# Patient Record
Sex: Female | Born: 1965 | Hispanic: Yes | Marital: Single | Smoking: Never smoker
Health system: Southern US, Community
[De-identification: ages and names within clinical notes are randomized; demographics above are authoritative.]

## PROBLEM LIST (undated history)

## (undated) HISTORY — PX: PARTIAL HYSTERECTOMY: SHX80

---

## 2020-10-25 ENCOUNTER — Emergency Department (HOSPITAL_COMMUNITY): Payer: Self-pay

## 2020-10-25 ENCOUNTER — Other Ambulatory Visit: Payer: Self-pay

## 2020-10-25 ENCOUNTER — Emergency Department (HOSPITAL_COMMUNITY)
Admission: EM | Admit: 2020-10-25 | Discharge: 2020-10-25 | Disposition: A | Discharge status: Home / Self Care | Payer: Self-pay | Attending: Emergency Medicine | Admitting: Emergency Medicine

## 2020-10-25 DIAGNOSIS — D329 Benign neoplasm of meninges, unspecified: Secondary | ICD-10-CM | POA: Insufficient documentation

## 2020-10-25 DIAGNOSIS — M25531 Pain in right wrist: Secondary | ICD-10-CM | POA: Insufficient documentation

## 2020-10-25 DIAGNOSIS — R52 Pain, unspecified: Secondary | ICD-10-CM

## 2020-10-25 DIAGNOSIS — W19XXXA Unspecified fall, initial encounter: Secondary | ICD-10-CM

## 2020-10-25 DIAGNOSIS — M542 Cervicalgia: Secondary | ICD-10-CM | POA: Insufficient documentation

## 2020-10-25 DIAGNOSIS — R930 Abnormal findings on diagnostic imaging of skull and head, not elsewhere classified: Secondary | ICD-10-CM | POA: Insufficient documentation

## 2020-10-25 DIAGNOSIS — W06XXXA Fall from bed, initial encounter: Secondary | ICD-10-CM | POA: Insufficient documentation

## 2020-10-25 NOTE — ED Notes (Signed)
Pt transported to CT ?

## 2020-10-25 NOTE — ED Provider Notes (Signed)
[facility] EMERGENCY DEPARTMENT Provider Note   CSN: [id number] Arrival date & time: 10/25/20  1021     History Chief Complaint  Patient presents with   Fall    Madison Patterson is a 55 y.o. female.  Patient is a 55 yo female presenting for fall. Pt admits to falling out of bed early Wednesday morning, greater than 24 hours ago, while sleeping. Admits to head trauma-hit head on dresser, with residual midline neck pain. Patient states she also fell on her right side injuring her right wrist and right shin. States she has otherwise been feeling well. Denies repeat falls or syncope. Denies sensation or motor deficits.   The history is provided by the patient. No language interpreter was used.  Fall This is a new problem. The current episode started yesterday. The problem has been resolved. Pertinent negatives include no chest pain, no abdominal pain and no shortness of breath.      No past medical history on file.  There are no problems to display for this patient.    OB History   No obstetric history on file.     No family history on file.     Home Medications Prior to Admission medications   Not on File    Allergies    Patient has no allergy information on record.  Review of Systems   Review of Systems  Constitutional:  Negative for chills and fever.  HENT:  Negative for ear pain and sore throat.   Eyes:  Negative for pain and visual disturbance.  Respiratory:  Negative for cough and shortness of breath.   Cardiovascular:  Negative for chest pain and palpitations.  Gastrointestinal:  Negative for abdominal pain and vomiting.  Genitourinary:  Negative for dysuria and hematuria.  Musculoskeletal:  Positive for neck pain. Negative for arthralgias and back pain.       Right wrist pain   Skin:  Negative for color change and rash.  Neurological:  Negative for seizures and syncope.  All other systems reviewed and are negative.  Physical  Exam Updated Vital Signs BP 121/67   Pulse (!) 59   Temp 98.1 F (36.7 C)   Resp 16   SpO2 100%   Physical Exam Vitals and nursing note reviewed.  Constitutional:      General: She is not in acute distress.    Appearance: She is well-developed.  HENT:     Head: Normocephalic and atraumatic.  Eyes:     General: Lids are normal.     Conjunctiva/sclera: Conjunctivae normal.     Pupils: Pupils are equal, round, and reactive to light.  Cardiovascular:     Rate and Rhythm: Normal rate and regular rhythm.     Heart sounds: No murmur heard. Pulmonary:     Effort: Pulmonary effort is normal. No respiratory distress.     Breath sounds: Normal breath sounds.  Abdominal:     Palpations: Abdomen is soft.     Tenderness: There is no abdominal tenderness.  Musculoskeletal:     Right shoulder: No bony tenderness.     Left shoulder: No bony tenderness.     Right upper arm: No bony tenderness.     Left upper arm: No bony tenderness.     Right elbow: No tenderness.     Left elbow: No tenderness.     Right forearm: No bony tenderness.     Left forearm: No bony tenderness.     Right wrist: Bony tenderness present.  Normal pulse.     Left wrist: No bony tenderness. Normal pulse.     Right hand: No bony tenderness.     Left hand: No bony tenderness.     Cervical back: Neck supple. Bony tenderness present.     Thoracic back: No bony tenderness.     Lumbar back: No bony tenderness.  Skin:    General: Skin is warm and dry.       Neurological:     Mental Status: She is alert.     GCS: GCS eye subscore is 4. GCS verbal subscore is 5. GCS motor subscore is 6.     Sensory: Sensation is intact.     Motor: Motor function is intact.     Coordination: Coordination is intact.     Gait: Gait is intact.    ED Results / Procedures / Treatments   Labs (all labs ordered are listed, but only abnormal results are displayed) Labs Reviewed - No data to display  EKG None  Radiology DG Thoracic  Spine 2 View  Result Date: 10/25/2020 CLINICAL DATA:  Back pain after fall from bed yesterday EXAM: THORACIC SPINE 2 VIEWS COMPARISON:  None. FINDINGS: Mild midthoracic disc space narrowing and spondylitic spurring which is fairly bulky for age. No evidence of superimposed fracture. No subluxation. No appreciable bone lesion. Maintained posterior mediastinal fat planes. IMPRESSION: 1. No acute or focal finding. 2. Midthoracic spondylosis. Electronically Signed   By: Monte Fantasia M.D.   On: 10/25/2020 11:33   CT Head Wo Contrast  Result Date: 10/25/2020 CLINICAL DATA:  Minor head injury. EXAM: CT HEAD WITHOUT CONTRAST CT CERVICAL SPINE WITHOUT CONTRAST TECHNIQUE: Multidetector CT imaging of the head and cervical spine was performed following the standard protocol without intravenous contrast. Multiplanar CT image reconstructions of the cervical spine were also generated. COMPARISON:  None. FINDINGS: CT HEAD FINDINGS Brain: 2.3 x 1.9 cm rounded hyperdense abnormality is noted in the medial portion of the right parietal cortex adjacent to the interhemispheric fissure which crosses slightly to the left side. This most likely represents neoplasm such as meningioma. No definite hemorrhage is noted. No ventricular dilatation is noted. No midline shift is noted. No acute infarction is noted. Vascular: No hyperdense vessel or unexpected calcification. Skull: Normal. Negative for fracture or focal lesion. Sinuses/Orbits: No acute finding. Other: None. CT CERVICAL SPINE FINDINGS Alignment: Normal. Skull base and vertebrae: No acute fracture. No primary bone lesion or focal pathologic process. Soft tissues and spinal canal: No prevertebral fluid or swelling. No visible canal hematoma. Disc levels: Mild degenerative disc disease is noted at C4-5 and C5-6 with anterior osteophyte formation. Upper chest: Negative. Other: None. IMPRESSION: 2.3 cm rounded hyperdense abnormality is noted in medial portion of right parietal  cortex which crosses the midline slightly, most consistent with neoplasm such as meningioma. Further evaluation with MRI with and without gadolinium is recommended for further evaluation. Mild multilevel degenerative disc disease is noted in the cervical spine. No acute abnormality is noted. Electronically Signed   By: Marijo Conception M.D.   On: 10/25/2020 12:43   CT Cervical Spine Wo Contrast  Result Date: 10/25/2020 CLINICAL DATA:  Minor head injury. EXAM: CT HEAD WITHOUT CONTRAST CT CERVICAL SPINE WITHOUT CONTRAST TECHNIQUE: Multidetector CT imaging of the head and cervical spine was performed following the standard protocol without intravenous contrast. Multiplanar CT image reconstructions of the cervical spine were also generated. COMPARISON:  None. FINDINGS: CT HEAD FINDINGS Brain: 2.3 x 1.9 cm rounded hyperdense  abnormality is noted in the medial portion of the right parietal cortex adjacent to the interhemispheric fissure which crosses slightly to the left side. This most likely represents neoplasm such as meningioma. No definite hemorrhage is noted. No ventricular dilatation is noted. No midline shift is noted. No acute infarction is noted. Vascular: No hyperdense vessel or unexpected calcification. Skull: Normal. Negative for fracture or focal lesion. Sinuses/Orbits: No acute finding. Other: None. CT CERVICAL SPINE FINDINGS Alignment: Normal. Skull base and vertebrae: No acute fracture. No primary bone lesion or focal pathologic process. Soft tissues and spinal canal: No prevertebral fluid or swelling. No visible canal hematoma. Disc levels: Mild degenerative disc disease is noted at C4-5 and C5-6 with anterior osteophyte formation. Upper chest: Negative. Other: None. IMPRESSION: 2.3 cm rounded hyperdense abnormality is noted in medial portion of right parietal cortex which crosses the midline slightly, most consistent with neoplasm such as meningioma. Further evaluation with MRI with and without  gadolinium is recommended for further evaluation. Mild multilevel degenerative disc disease is noted in the cervical spine. No acute abnormality is noted. Electronically Signed   By: Marijo Conception M.D.   On: 10/25/2020 12:43    Procedures Procedures   Medications Ordered in ED Medications - No data to display  ED Course  I have reviewed the triage vital signs and the nursing notes.  Pertinent labs & imaging results that were available during my care of the patient were reviewed by me and considered in my medical decision making (see chart for details).    MDM Rules/Calculators/A&P                          6:28 PM  55 yo female presenting for fall out of bed while sleeping over 24 hours ago. No hx of blood thinner use. Patient is Aox3, no acute distress, afebrile, with stable vitals. Physical exam demonstrates no neurovascular deficits. CT head and cervical spine ordered by triage team. No new traumatic process, however, positive for brain meningioma. Discussion held with patient and daughter at bedside with recommendations for close follow up with neurology. Patient recommended to call tomorrow to establish appointment and to return to ED for any development of neurological dysfunction.  Physical exam also pertinent for right wrist pain. Xray demonstrates no fractures.  Patient in no distress and overall condition improved here in the ED. Detailed discussions were had with the patient regarding current findings, and need for close f/u with PCP or on call doctor. The patient has been instructed to return immediately if the symptoms worsen in any way for re-evaluation. Patient verbalized understanding and is in agreement with current care plan. All questions answered prior to discharge.      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

## 2020-10-25 NOTE — ED Provider Notes (Signed)
Emergency Medicine Provider Triage Evaluation Note  Madison Patterson , a 55 y.o. female  was evaluated in triage.  Pt complains of fall.  She fell getting out of bed and landed onto furniture on the other side of her bed.  She fell on the right side of her body and hit the back of her head.  She is not on any blood thinners, the pain is primarily to her thoracic back and also to her right hand and her feet bilaterally.  She has small abrasions on the knees bilaterally..  Review of Systems  Positive: Head pain, neck pain, back pain, hand pain, feet pain Negative: Vision changes, nausea, vomiting  Physical Exam  BP (!) 123/111 (BP Location: Left Arm)   Pulse 69   Temp 98.3 F (36.8 C)   Resp 18   SpO2 98%  Gen:   Awake, no distress   Resp:  Normal effort  MSK:   Moves extremities without difficulty.  Other:  Cranial nerves III through XII are grossly intact.  Medical Decision Making  Medically screening exam initiated at 10:33 AM.  Appropriate orders placed.  Sisilia Renovato was informed that the remainder of the evaluation will be completed by another provider, this initial triage assessment does not replace that evaluation, and the importance of remaining in the ED until their evaluation is complete.     Sherrill Raring, PA-C 10/25/20 1034    Valarie Merino, MD 10/27/20 [phone]

## 2020-10-25 NOTE — ED Triage Notes (Signed)
Pt from home for fall yesterday at 0600 where she fell and hit a piece of furniture when getting out of bed, hitting the back of her head . Woke up this morning with back pain down R arm and hand, head, abrasion to R shin. OTC meds taken without relief. Pt ambulatory.

## 2020-12-13 ENCOUNTER — Telehealth: Payer: Self-pay | Admitting: Neurology

## 2020-12-13 ENCOUNTER — Ambulatory Visit (INDEPENDENT_AMBULATORY_CARE_PROVIDER_SITE_OTHER): Payer: Self-pay | Admitting: Neurology

## 2020-12-13 ENCOUNTER — Encounter: Payer: Self-pay | Admitting: Neurology

## 2020-12-13 VITALS — BP 134/72 | HR 68 | Ht 62.0 in | Wt 236.5 lb

## 2020-12-13 DIAGNOSIS — D329 Benign neoplasm of meninges, unspecified: Secondary | ICD-10-CM

## 2020-12-13 NOTE — Progress Notes (Signed)
[facility]  PATIENT: Madison Patterson DOB: 1965/07/04  REFERRING CLINICIAN: No ref. provider found HISTORY FROM: Patient via spanish interpreter [place] VISIT: Meningioma    HISTORICAL  CHIEF COMPLAINT:  Chief Complaint  Patient presents with   meningioma    New patient: internal referral: meningioma Room 12, daughter in law [place] and interpreter present: Lily    HISTORY OF PRESENT ILLNESS:  This is a 55 year old woman with past medical history of obesity who presented today after being found to have a meningioma on CT scan.  Patient stated that she fell a month ago and landed on her right side, she had neck pain, right shoulder pain and elbow pain therefore she presented to the ED.  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  Because of this finding she was referred here to neurology for further evaluation.  Patient denies any new headaches, denies any weakness but said that she had in the past month two trip and fall and landed on the right side of her body.  She is very concerned about the meningioma and very tearful.  Denies any seizure-like activity, denies any unilateral weakness, no numbness and no tingling.  Currently her only medication is Flonase.    OTHER MEDICAL CONDITIONS: Obesity.    REVIEW OF SYSTEMS: Full 14 system review of systems performed and negative with exception of: as noted in the HPI  ALLERGIES: No Known Allergies  HOME MEDICATIONS: Outpatient Medications Prior to Visit  Medication Sig Dispense Refill   fluticasone (FLONASE) 50 MCG/ACT nasal spray Place into both nostrils daily.     No facility-administered medications prior to visit.    PAST MEDICAL HISTORY: History reviewed. No pertinent past medical history.  PAST SURGICAL HISTORY: Past Surgical History:  Procedure Laterality Date   PARTIAL HYSTERECTOMY      FAMILY HISTORY: History reviewed. No  pertinent family history.  SOCIAL HISTORY: Social History   Socioeconomic History   Marital status: Single    Spouse name: Not on file   Number of children: Not on file   Years of education: Not on file   Highest education level: Not on file  Occupational History   Not on file  Tobacco Use   Smoking status: Never   Smokeless tobacco: Never  Substance and Sexual Activity   Alcohol use: Not on file   Drug use: Never   Sexual activity: Not on file  Other Topics Concern   Not on file  Social History Narrative   Shares living with her 2 sons, goes to each of their homes    Left Handed   Drinks 1-2 cups caffeine daily   Social Determinants of Health   Financial Resource Strain: Not on file  Food Insecurity: Not on file  Transportation Needs: Not on file  Physical Activity: Not on file  Stress: Not on file  Social Connections: Not on file  Intimate Partner Violence: Not on file     PHYSICAL EXAM  GENERAL EXAM/CONSTITUTIONAL: Vitals:  Vitals:   12/13/20 1016  BP: 134/72  Pulse: 68  Weight: 236 lb 8 oz (107.3 kg)  Height: 5\' 2"  (1.575 m)   Body mass index is 43.26 kg/m. Wt Readings from Last 3 Encounters:  12/13/20 236 lb 8 oz (107.3 kg)   Patient is in no distress; well developed, nourished and groomed; neck is supple  EYES: Pupils round and reactive to  light, Visual fields full to confrontation, Extraocular movements intacts,   MUSCULOSKELETAL: Gait, strength, tone, movements noted in Neurologic exam below  NEUROLOGIC: MENTAL STATUS:  No flowsheet data found. awake, alert, oriented to person, place and time recent and remote memory intact normal attention and concentration language fluent, comprehension intact, naming intact fund of knowledge appropriate  CRANIAL NERVE:  2nd, 3rd, 4th, 6th - pupils equal and reactive to light, visual fields full to confrontation, extraocular muscles intact, no nystagmus 5th - facial sensation symmetric 7th - facial  strength symmetric 8th - hearing intact 9th - palate elevates symmetrically, uvula midline 11th - shoulder shrug symmetric 12th - tongue protrusion midline  MOTOR:  normal bulk and tone, full strength in the BUE, BLE  SENSORY:  normal and symmetric to light touch, pinprick, temperature, vibration  COORDINATION:  finger-nose-finger, fine finger movements normal  REFLEXES:  deep tendon reflexes present and symmetric  GAIT/STATION:  normal   DIAGNOSTIC DATA (LABS, IMAGING, TESTING) - I reviewed patient records, labs, notes, testing and imaging myself where available.  No results found for: WBC, HGB, HCT, MCV, PLT No results found for: NA, K, CL, CO2, GLUCOSE, BUN, CREATININE, CALCIUM, PROT, ALBUMIN, AST, ALT, ALKPHOS, BILITOT, GFRNONAA, GFRAA No results found for: CHOL, HDL, LDLCALC, LDLDIRECT, TRIG, CHOLHDL No results found for: HGBA1C No results found for: VITAMINB12 No results found for: TSH   CT head 10/25/2020 2.3 cm rounded hyperdense abnormality is noted in medial portion of right parietal cortex which crosses the midline slightly, most consistent with neoplasm such as meningioma. Further evaluation with MRI with and without gadolinium is recommended for further evaluation.   ASSESSMENT AND PLAN  55 y.o. year old female with likely meningioma in the right parietal cortex found incidentally on head CT who is presenting for further evaluation.  On exam there are no neurological deficits.  She denies any new headache, denies any unilateral weakness, and no new seizures.  I will order a brain MRI with and without contrast for further evaluation.  I will see the patient in 3 months for follow-up and at that time I will send her for a neurosurgical evaluation.   1. Meningioma ([place])     PLAN: MRI Brain with and without contrast  Return to clinic in 3 months   Orders Placed This Encounter  Procedures   MR BRAIN W WO CONTRAST     No orders of the defined types were  placed in this encounter.   Return in about 3 months (around 03/15/2021).    Alric Ran, MD 12/13/2020, 12:00 PM  [facility] [address] [place], [place] [address] [phone]

## 2020-12-13 NOTE — Patient Instructions (Signed)
MRI Brain with and without contrast  Return to clinic in 3 months

## 2020-12-13 NOTE — Telephone Encounter (Signed)
self pay order sent to GI. They will reach out to the patient to schedule.  °

## 2021-01-24 ENCOUNTER — Ambulatory Visit
Admission: RE | Admit: 2021-01-24 | Discharge: 2021-01-24 | Disposition: A | Discharge status: Home / Self Care | Payer: No Typology Code available for payment source | Source: Ambulatory Visit | Attending: Neurology | Admitting: Neurology

## 2021-01-24 ENCOUNTER — Other Ambulatory Visit: Payer: Self-pay | Admitting: Neurology

## 2021-01-24 ENCOUNTER — Other Ambulatory Visit: Payer: Self-pay

## 2021-01-24 DIAGNOSIS — D329 Benign neoplasm of meninges, unspecified: Secondary | ICD-10-CM

## 2021-02-07 NOTE — Progress Notes (Signed)
Left a message for patient using pac[place] interpreter Geraldo Pitter [phone] regarding her recent MRI results.   Alric Ran, MD

## 2021-02-08 NOTE — Progress Notes (Signed)
Left message for patient with interpreter informing her that the meningioma is stable,  we can repeat the MRI in 1 to 2 years if she does not have any new neurological symptoms.  Otherwise I will see her in 3 months for follow-up at that time we will discuss more about the MRI result  Alric Ran, MD

## 2021-03-18 ENCOUNTER — Telehealth: Payer: Self-pay | Admitting: Neurology

## 2021-03-18 NOTE — Telephone Encounter (Signed)
Pt's daughter in law wants to know if the appointment is needed for 03-21-21

## 2021-03-18 NOTE — Telephone Encounter (Signed)
Message from Dr. April Manson on 02/08/21:  Left message for patient with interpreter informing her that the meningioma is stable,  we can repeat the MRI in 1 to 2 years if she does not have any new neurological symptoms.  Otherwise I will see her in 3 months for follow-up at that time we will discuss more about the MRI result. _____________________________________  The patient is self pay (no insurance). She would like to know if the appt on 03/21/21 is necessary to keep since her MRI findings are stable.  ______________________________________  Per vo by Dr. April Manson, okay for her to return for a yearly follow up. If needed before then, we are happy to see her sooner. Appt rescheduled with her friend on DPR. Pending 01/02/22.

## 2021-03-21 ENCOUNTER — Ambulatory Visit: Payer: Self-pay | Admitting: Neurology

## 2022-01-02 ENCOUNTER — Ambulatory Visit: Payer: Self-pay | Admitting: Neurology

## 2022-01-02 ENCOUNTER — Telehealth: Payer: Self-pay | Admitting: Neurology

## 2022-01-02 NOTE — Telephone Encounter (Signed)
Pt's daughter, Jearld Shines cancelled pt's appt due to work schedule conflict.

## 2022-04-01 ENCOUNTER — Ambulatory Visit (INDEPENDENT_AMBULATORY_CARE_PROVIDER_SITE_OTHER): Payer: Self-pay | Admitting: Neurology

## 2022-04-01 ENCOUNTER — Encounter: Payer: Self-pay | Admitting: Neurology

## 2022-04-01 ENCOUNTER — Telehealth: Payer: Self-pay | Admitting: Neurology

## 2022-04-01 VITALS — BP 118/63 | HR 70 | Ht 62.0 in | Wt 235.5 lb

## 2022-04-01 DIAGNOSIS — D329 Benign neoplasm of meninges, unspecified: Secondary | ICD-10-CM

## 2022-04-01 NOTE — Patient Instructions (Signed)
Repeat MRI Brain with and without contrast  Follow up in one year or sooner if worse

## 2022-04-01 NOTE — Telephone Encounter (Signed)
Self-pay sent to GI [phone]

## 2022-04-01 NOTE — Progress Notes (Signed)
[facility]  PATIENT: Madison Patterson DOB: [birth year]  REFERRING CLINICIAN: No ref. provider found HISTORY FROM: Patient via spanish interpreter [place] VISIT: Meningioma    HISTORICAL  CHIEF COMPLAINT:  Chief Complaint  Patient presents with   Follow-up    Rm 12. Accompanied by interpreter. C/o right arm pain. Does not feel like walking most days. She has difficulty sleeping. Denies any weakness.   INTERVAL HISTORY 04/01/2022:  Patient presents today for follow-up, last visit was in November 2022.  At that time we repeated her MRI Brain and her meningioma was stable at 2.2 cm.  Since then she has been stable.  She is presenting today with interpreter Edison Nasuti.  She is complaining of generalized weakness, right shoulder and right arm pain state that she cannot lift anything with the right arm.  Denies any headaches, and increased confusion and no seizures.  She denies also any focal abnormality in the left side of her body.    HISTORY OF PRESENT ILLNESS:  This is a 57 year old woman with past medical history of obesity who presented today after being found to have a meningioma on CT scan.  Patient stated that she fell a month ago and landed on her right side, she had neck pain, right shoulder pain and elbow pain therefore she presented to the ED.  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  Because of this finding she was referred here to neurology for further evaluation.  Patient denies any new headaches, denies any weakness but said that she had in the past month two trip and fall and landed on the right side of her body.  She is very concerned about the meningioma and very tearful.  Denies any seizure-like activity, denies any unilateral weakness, no numbness and no tingling.  Currently her only medication is Flonase.    OTHER MEDICAL CONDITIONS: Obesity.    REVIEW OF SYSTEMS: Full 14 system review  of systems performed and negative with exception of: as noted in the HPI  ALLERGIES: No Known Allergies  HOME MEDICATIONS: Outpatient Medications Prior to Visit  Medication Sig Dispense Refill   NON FORMULARY She takes an OTC medication for pain. Unsure of the name.     fluticasone (FLONASE) 50 MCG/ACT nasal spray Place into both nostrils daily.     No facility-administered medications prior to visit.    PAST MEDICAL HISTORY: History reviewed. No pertinent past medical history.  PAST SURGICAL HISTORY: Past Surgical History:  Procedure Laterality Date   PARTIAL HYSTERECTOMY      FAMILY HISTORY: History reviewed. No pertinent family history.  SOCIAL HISTORY: Social History   Socioeconomic History   Marital status: Single    Spouse name: Not on file   Number of children: Not on file   Years of education: Not on file   Highest education level: Not on file  Occupational History   Not on file  Tobacco Use   Smoking status: Never   Smokeless tobacco: Never  Substance and Sexual Activity   Alcohol use: Not on file   Drug use: Never   Sexual activity: Not on file  Other Topics Concern   Not on file  Social History Narrative   Shares living with her 2 sons, goes to each of their homes    Left Handed   Drinks 1-2 cups caffeine daily   Social Determinants of Health   Financial  Resource Strain: Not on file  Food Insecurity: Not on file  Transportation Needs: Not on file  Physical Activity: Not on file  Stress: Not on file  Social Connections: Not on file  Intimate Partner Violence: Not on file     PHYSICAL EXAM  GENERAL EXAM/CONSTITUTIONAL: Vitals:  Vitals:   04/01/22 1337  BP: 118/63  Pulse: 70  Weight: 235 lb 8 oz (106.8 kg)  Height: '5\' 2"'$  (1.575 m)    Body mass index is 43.07 kg/m. Wt Readings from Last 3 Encounters:  04/01/22 235 lb 8 oz (106.8 kg)  12/13/20 236 lb 8 oz (107.3 kg)   Patient is in no distress; well developed, nourished and  groomed; neck is supple  EYES: Visual fields full to confrontation, Extraocular movements intacts,   MUSCULOSKELETAL: Gait, strength, tone, movements noted in Neurologic exam below  NEUROLOGIC: MENTAL STATUS:      No data to display         awake, alert, oriented to person, not place or time. Difficulty following direction recent and remote memory intact normal attention and concentration language fluent, comprehension intact, naming intact fund of knowledge appropriate  CRANIAL NERVE:  2nd, 3rd, 4th, 6th - visual fields full to confrontation, extraocular muscles intact, no nystagmus 5th - facial sensation symmetric 7th - facial strength symmetric 8th - hearing intact 9th - palate elevates symmetrically, uvula midline 11th - shoulder shrug symmetric 12th - tongue protrusion midline  MOTOR:  normal bulk and tone, full strength in the BUE, BLE  SENSORY:  normal and symmetric to light touch  COORDINATION:  finger-nose-finger, fine finger movements normal  GAIT/STATION:  normal   DIAGNOSTIC DATA (LABS, IMAGING, TESTING) - I reviewed patient records, labs, notes, testing and imaging myself where available.  No results found for: "WBC", "HGB", "HCT", "MCV", "PLT" No results found for: "NA", "K", "CL", "CO2", "GLUCOSE", "BUN", "CREATININE", "CALCIUM", "PROT", "ALBUMIN", "AST", "ALT", "ALKPHOS", "BILITOT", "GFRNONAA", "GFRAA" No results found for: "CHOL", "HDL", "LDLCALC", "LDLDIRECT", "TRIG", "CHOLHDL" No results found for: "HGBA1C" No results found for: "VITAMINB12" No results found for: "TSH"   CT head 10/25/2020 2.3 cm rounded hyperdense abnormality is noted in medial portion of right parietal cortex which crosses the midline slightly, most consistent with neoplasm such as meningioma. Further evaluation with MRI with and without gadolinium is recommended for further evaluation.  MRI Brain 01/24/2021 - Right parasagittal extra-axial mass measuring 2.2 x 2.0 x 2.2  cm (AP x trans x SI).  The mass crosses interhemispheric fissure towards the left side by 5 mm.  - Due to claustrophobia patient was not able to complete study with IV contrast.  Overall imaging characteristics suggest meningioma.   ASSESSMENT AND PLAN  57 y.o. year old female with meningioma in the right parietal cortex who is presenting for follow up.  Her repeat MRI showed that the meningioma was stable at 2.2 cm.  She denies any new headaches, no seizures, but today on exam she was noted to have difficulty following commands and some mild confusion about the date and month of the year.  I will repeat her MRI to make sure the meningioma is not increasing in size and if stable I will see the patient in a year.  This was discussed with patient and she is comfortable with plans.  For her complaint of generalized weakness, right arm pain and right shoulder pain I did advise her to follow-up with her PCP.  She reports that she will set up an appointment for next  week.   1. Meningioma [facility])     Patient Instructions  Repeat MRI Brain with and without contrast  Follow up in one year or sooner if worse    Orders Placed This Encounter  Procedures   MR BRAIN W WO CONTRAST     No orders of the defined types were placed in this encounter.    Return in about 1 year (around 04/02/2023).  I have spent a total of 30 minutes dedicated to this patient today, preparing to see patient, performing a medically appropriate examination and evaluation, ordering tests and/or medications and procedures, and counseling and educating the patient/family/caregiver; independently interpreting result and communicating results to the family/patient/caregiver; and documenting clinical information in the electronic medical record.    Alric Ran, MD 04/01/2022, 2:15 PM  [facility] [address] [place], [place] [address] [phone]

## 2022-04-21 ENCOUNTER — Other Ambulatory Visit: Payer: Self-pay

## 2022-05-11 ENCOUNTER — Ambulatory Visit
Admission: RE | Admit: 2022-05-11 | Discharge: 2022-05-11 | Disposition: A | Discharge status: Home / Self Care | Payer: Self-pay | Source: Ambulatory Visit | Attending: Neurology | Admitting: Neurology

## 2022-05-11 DIAGNOSIS — D329 Benign neoplasm of meninges, unspecified: Secondary | ICD-10-CM

## 2022-05-11 MED ORDER — GADOPICLENOL 0.5 MMOL/ML IV SOLN
10.0000 mL | Freq: Once | INTRAVENOUS | Status: AC | PRN
  Administered 2022-05-11: 10 mL via INTRAVENOUS

## 2022-05-13 NOTE — Progress Notes (Signed)
Please call and inform patient that her meningioma is stable is size. Will consider repeating the images in 3 to 5 years or sooner if she is having new symptoms.   Dr. April Manson

## 2022-05-14 ENCOUNTER — Telehealth: Payer: Self-pay | Admitting: Neurology

## 2022-05-14 NOTE — Telephone Encounter (Signed)
Called the number on file which appears to be daughter in law (on [place]) no answer LVM asking for a call back

## 2022-05-14 NOTE — Telephone Encounter (Signed)
-----   Message from Alric Ran, MD sent at 05/13/2022 12:26 PM EST ----- Please call and inform patient that her meningioma is stable is size. Will consider repeating the images in 3 to 5 years or sooner if she is having new symptoms.   Dr. April Manson

## 2022-05-14 NOTE — Telephone Encounter (Signed)
Pt daughter in law returned a call from nurse. Requesting a call back

## 2022-05-14 NOTE — Telephone Encounter (Signed)
Called the daughter in law back and reviewed the results from the MRI. Advised of the finding and recommendation. Pt verbalized understanding. Pt had no questions at this time but was encouraged to call back if questions arise.

## 2022-09-01 ENCOUNTER — Encounter (HOSPITAL_COMMUNITY): Payer: Self-pay

## 2022-09-01 ENCOUNTER — Emergency Department (HOSPITAL_COMMUNITY)
Admission: EM | Admit: 2022-09-01 | Discharge: 2022-09-01 | Disposition: A | Discharge status: Home / Self Care | Payer: Self-pay | Attending: Emergency Medicine | Admitting: Emergency Medicine

## 2022-09-01 ENCOUNTER — Other Ambulatory Visit: Payer: Self-pay

## 2022-09-01 ENCOUNTER — Emergency Department (HOSPITAL_COMMUNITY): Payer: Self-pay

## 2022-09-01 DIAGNOSIS — N898 Other specified noninflammatory disorders of vagina: Secondary | ICD-10-CM | POA: Insufficient documentation

## 2022-09-01 DIAGNOSIS — M545 Low back pain, unspecified: Secondary | ICD-10-CM | POA: Insufficient documentation

## 2022-09-01 LAB — URINALYSIS, ROUTINE W REFLEX MICROSCOPIC
Bilirubin Urine: NEGATIVE
Glucose, UA: NEGATIVE mg/dL
Hgb urine dipstick: NEGATIVE
Ketones, ur: NEGATIVE mg/dL
Nitrite: NEGATIVE
Protein, ur: NEGATIVE mg/dL
Specific Gravity, Urine: 1.009 (ref 1.005–1.030)
pH: 5 (ref 5.0–8.0)

## 2022-09-01 LAB — WET PREP, GENITAL
Clue Cells Wet Prep HPF POC: NONE SEEN
Sperm: NONE SEEN
Trich, Wet Prep: NONE SEEN
WBC, Wet Prep HPF POC: >=10 — AB (ref <10)
Yeast Wet Prep HPF POC: NONE SEEN

## 2022-09-01 MED ORDER — IBUPROFEN 600 MG PO TABS: 600.0000 mg | ORAL_TABLET | Freq: Four times a day (QID) | ORAL | 0 refills | Status: AC | PRN

## 2022-09-01 MED ORDER — LIDOCAINE 5 % EX PTCH: 1.0000 | MEDICATED_PATCH | CUTANEOUS | 0 refills | Status: AC

## 2022-09-01 MED ORDER — ONDANSETRON 4 MG PO TBDP
8.0000 mg | ORAL_TABLET | Freq: Once | ORAL | Status: AC
  Administered 2022-09-01: 8 mg via ORAL
  Filled 2022-09-01: qty 2

## 2022-09-01 MED ORDER — IBUPROFEN 800 MG PO TABS
800.0000 mg | ORAL_TABLET | Freq: Once | ORAL | Status: AC
  Administered 2022-09-01: 800 mg via ORAL
  Filled 2022-09-01: qty 1

## 2022-09-01 MED ORDER — OXYCODONE-ACETAMINOPHEN 5-325 MG PO TABS
2.0000 | ORAL_TABLET | Freq: Once | ORAL | Status: AC
  Administered 2022-09-01: 2 via ORAL
  Filled 2022-09-01: qty 2

## 2022-09-01 MED ORDER — FLUCONAZOLE 150 MG PO TABS: 150.0000 mg | ORAL_TABLET | Freq: Every day | ORAL | 0 refills | Status: AC | PRN

## 2022-09-01 MED ORDER — CEFADROXIL 500 MG PO CAPS: 500.0000 mg | ORAL_CAPSULE | Freq: Two times a day (BID) | ORAL | 0 refills | Status: AC

## 2022-09-01 MED ORDER — METHOCARBAMOL 500 MG PO TABS: 500.0000 mg | ORAL_TABLET | Freq: Two times a day (BID) | ORAL | 0 refills | Status: AC | PRN

## 2022-09-01 MED ORDER — CLOTRIMAZOLE 1 % VA CREA: 1.0000 | TOPICAL_CREAM | Freq: Every day | VAGINAL | 0 refills | Status: AC

## 2022-09-01 NOTE — ED Provider Triage Note (Signed)
Emergency Medicine Provider Triage Evaluation Note  Madison Patterson , a 57 y.o. female  was evaluated in triage.  Pt complains of back pain Leaning over dryer and then felt pop with pain in low back radiates around bilaterally.  No leg pain, weakness, pain with walking  Review of Systems  Positive: Pain low back Negative: No urinary retention loss of bowel or bladder controll  Physical Exam  BP 134/75 (BP Location: Right Arm)   Pulse 63   Temp 98.8 F (37.1 C)   Resp 20   SpO2 95%  Gen:   Awake, no distress   Resp:  Normal effort  MSK:   Moves extremities without difficulty antalgic with leg raise Other:  Strength equal  Medical Decision Making  Medically screening exam initiated at 9:56 AM.  Appropriate orders placed.  Madison Patterson was informed that the remainder of the evaluation will be completed by another provider, this initial triage assessment does not replace that evaluation, and the importance of remaining in the ED until their evaluation is complete.  Ho cns meningioma  Ls spine x Madison Patterson ordered and ibuprofen and percocet   Margarita Grizzle, MD 09/01/22 1000

## 2022-09-01 NOTE — Discharge Instructions (Addendum)
Como ya hemos comentado, el anlisis de hoy es, en general, tranquilizador.  Las imgenes de rayos X no mostraron signos de Surveyor, minerals o dislocacin.  Sospecho que su dolor de espalda probablemente sea secundario a una lesin muscular.  Tratar esto con antiinflamatorios en forma de ibuprofeno y relajantes musculares para usar segn sea necesario.  Tenga en cuenta que el laxante muscular puede causar somnolencia, as que no conduzca mientras toma dicho medicamento hasta que se d cuenta de sus [place].  Tambin enviar parches anestsicos para usar [employer] reas de Engineer, mining.  Con respecto al flujo vaginal/sensacin de picazn, enviar una crema de clotrimazol para usar segn las indicaciones.  Su orina tambin pareca infectada, por lo que trataremos estos antibiticos en el mbito ambulatorio. Recomiende un seguimiento con un obstetra/gineclogo para una reevaluacin de su picazn vaginal, as como atencin primaria para una reevaluacin de su espalda.  No dude en regresar al departamento de emergencias si los signos y sntomas preocupantes que comentamos se vuelven evidentes.  As discussed, workup today overall reassuring.  X-ray imaging was without signs of fracture or dislocation.  I suspect your back pain is likely secondary to muscular injury.  Will treat this with anti-inflammatories in the form of ibuprofen as well as muscle laxer to use as needed.  Note that muscle laxer can cause drowsiness so please not drive while taking said medication until you realize its effects on you.  Will also send in numbing patches to use over areas of pain.  Regarding vaginal discharge/itching type sensation, I will send in clotrimazole cream to use as directed.  Your urine also looked infected so we will treat this antibiotics in the outpatient setting. Recommend follow-up with OB/GYN for reassessment of your vaginal itch as well as primary care for reassessment of your back.  Please do not hesitate to return to emergency  department if the worrisome signs and symptoms we discussed become apparent.

## 2022-09-01 NOTE — ED Triage Notes (Signed)
Patient was bending over yesterday removed clothing from dryer and felt something pop in lower back. Now pain with any ROM, traveling down right leg. Patient alert and oriented, no fall. No control problem with urination.

## 2022-09-01 NOTE — ED Provider Notes (Signed)
[place] EMERGENCY DEPARTMENT AT [facility] Provider Note   CSN: [id number] Arrival date & time: 09/01/22  7829     History {Add pertinent medical, surgical, social history, OB history to HPI:1} No chief complaint on file.   Madison Patterson is a 57 y.o. female.  HPI   57 year old female presents emergency department with complaints of back pain, vaginal discharge/itching.  Patient states the back pain began yesterday when she was bending over to lift up laundry.  She reported popping type sensation in her low back and has had pain ever since.  Denies taking any medications for said symptoms.  Denies fever, saddle anesthesia, bowel/bladder dysfunction, history of IV drug use, weakness or sensory deficits in lower extremities, known malignancy, prolonged corticosteroid use.  States the pain is localized to her low back and does not radiate down legs.  Has been able to ambulate but with pain in her low back.  Regarding vaginal discharge/itching type sensation, symptoms been present for the past 1 to 2 weeks.  Reports being seen and treated for similar symptoms by another provider of which reach resolution before returning.  States she is not sexually active and not concerned about STDs.  Denies any fever, abdominal pain, nausea, vomiting, urinary symptoms, change in bowel habits.  Past medical history significant for meningioma  Home Medications Prior to Admission medications   Medication Sig Start Date End Date Taking? Authorizing Provider  NON FORMULARY She takes an OTC medication for pain. Unsure of the name.    [provider]      Allergies    Patient has no known allergies.    Review of Systems   Review of Systems  All other systems reviewed and are negative.   Physical Exam Updated Vital Signs BP 134/75 (BP Location: Right Arm)   Pulse 63   Temp 98.8 F (37.1 C)   Resp 20   SpO2 95%  Physical Exam Vitals and nursing note reviewed. Exam conducted  with a chaperone present.  Constitutional:      General: She is not in acute distress.    Appearance: She is well-developed.  HENT:     Head: Normocephalic and atraumatic.  Eyes:     Conjunctiva/sclera: Conjunctivae normal.  Cardiovascular:     Rate and Rhythm: Normal rate and regular rhythm.     Heart sounds: No murmur heard. Pulmonary:     Effort: Pulmonary effort is normal. No respiratory distress.     Breath sounds: Normal breath sounds. No wheezing or rales.  Abdominal:     Palpations: Abdomen is soft.     Tenderness: There is no abdominal tenderness. There is no right CVA tenderness or left CVA tenderness.  Genitourinary:    Vagina: No signs of injury. Vaginal discharge present. No tenderness or bleeding.     Cervix: Normal.     Uterus: Normal.      Adnexa: Right adnexa normal and left adnexa normal.     Comments: Thin greenish-white vaginal discharge appreciated.  No obvious external rash. Musculoskeletal:        General: No swelling.     Cervical back: Neck supple.     Comments: No midline tenderness of cervical, thoracic spine.  Mild midline tenderness of lower lumbar spine without step-off or deformity noted.  Mild paraspinal tenderness noted bilaterally in lumbar region.  Straight leg raise negative bilaterally.  Patient with symmetric muscular strength 5 out of 5 bilaterally for hip flexion/extension, knee flexion/extension, ankle dorsi/plantarflexion.  No  sensory deficits along major nerve distributions of lower extremities.  DTR symmetric at patella.  Pedal pulses 2+ bilaterally.  No overlying skin abnormalities.  Skin:    General: Skin is warm and dry.     Capillary Refill: Capillary refill takes less than 2 seconds.  Neurological:     Mental Status: She is alert.  Psychiatric:        Mood and Affect: Mood normal.     ED Results / Procedures / Treatments   Labs (all labs ordered are listed, but only abnormal results are displayed) Labs Reviewed - No data to  display  EKG None  Radiology DG Lumbar Spine 2-3 Views  Result Date: 09/01/2022 CLINICAL DATA:  Pain after bending EXAM: LUMBAR SPINE - 2-3 VIEW COMPARISON:  None Available. FINDINGS: There is no evidence of lumbar spine fracture. Straightening of the lumbar spine. Disc height loss and marginal osteophytes at L4-L5 and L5-S1 with associated facet joint arthropathy. IMPRESSION: 1. No acute fracture. 2. Degenerative changes at L4-L5 and L5-S1. Electronically Signed   By: Larose Hires D.O.   On: 09/01/2022 10:28    Procedures Procedures  {Document cardiac monitor, telemetry assessment procedure when appropriate:1}  Medications Ordered in ED Medications  ibuprofen (ADVIL) tablet 800 mg (has no administration in time range)  oxyCODONE-acetaminophen (PERCOCET/ROXICET) 5-325 MG per tablet 2 tablet (has no administration in time range)    ED Course/ Medical Decision Making/ A&P   {   Click here for ABCD2, HEART and other calculatorsREFRESH Note before signing :1}                          Medical Decision Making Amount and/or Complexity of Data Reviewed Labs: ordered.  Risk Prescription drug management.   This patient presents to the ED for concern of back pain, vaginal discharge, this involves an extensive number of treatment options, and is a complaint that carries with it a high risk of complications and morbidity.  The differential diagnosis includes fracture, strain/sprain, dislocation, spinal cord compression, cauda equina, spinal epidural abscess, pyelonephritis, nephrolithiasis, sciatica, GC/committee, BV, vulvovaginal candidiasis,   Co morbidities that complicate the patient evaluation  See HPI   Additional history obtained:  Additional history obtained from EMR External records from outside source obtained and reviewed including the records   Lab Tests:  I Ordered, and personally interpreted labs.  The pertinent results include: Wet prep***.  GC/committee pending.   UA***   Imaging Studies ordered:  I ordered imaging studies including lumbar x-ray I independently visualized and interpreted imaging which showed no acute fracture.  Degenerative changes at L4-5, and L5-S1 I agree with the radiologist interpretation  Cardiac Monitoring: / EKG:  The patient was maintained on a cardiac monitor.  I personally viewed and interpreted the cardiac monitored which showed an underlying rhythm of: Sinus rhythm   Consultations Obtained:  N/a   Problem List / ED Course / Critical interventions / Medication management  Low back pain, vaginal discharge I ordered medication including Motrin, Percocet   Reevaluation of the patient after these medicines showed that the patient improved I have reviewed the patients home medicines and have made adjustments as needed   Social Determinants of Health:  Denies tobacco, illicit drug use   Test / Admission - Considered:  Low back pain, vaginal discharge Vitals signs within normal range and stable throughout visit. Laboratory/imaging studies significant for: See above *** Worrisome signs and symptoms were discussed with the patient, and  the patient acknowledged understanding to return to the ED if noticed. Patient was stable upon discharge.     {Document critical care time when appropriate:1} {Document review of labs and clinical decision tools ie heart score, Chads2Vasc2 etc:1}  {Document your independent review of radiology images, and any outside records:1} {Document your discussion with family members, caretakers, and with consultants:1} {Document social determinants of health affecting pt's care:1} {Document your decision making why or why not admission, treatments were needed:1} Final Clinical Impression(s) / ED Diagnoses Final diagnoses:  None    Rx / DC Orders ED Discharge Orders     None

## 2022-09-02 LAB — GC/CHLAMYDIA PROBE AMP (~~LOC~~) NOT AT ARMC
Chlamydia: NEGATIVE
Comment: NEGATIVE
Comment: NORMAL
Neisseria Gonorrhea: NEGATIVE

## 2022-10-05 IMAGING — DX DG WRIST 2V*R*
2 series · 2 of 2 positions shown · non-contrast
Comparison: None.

CLINICAL DATA: Fall

EXAM:
RIGHT WRIST - 2 VIEW

[wrist pa]
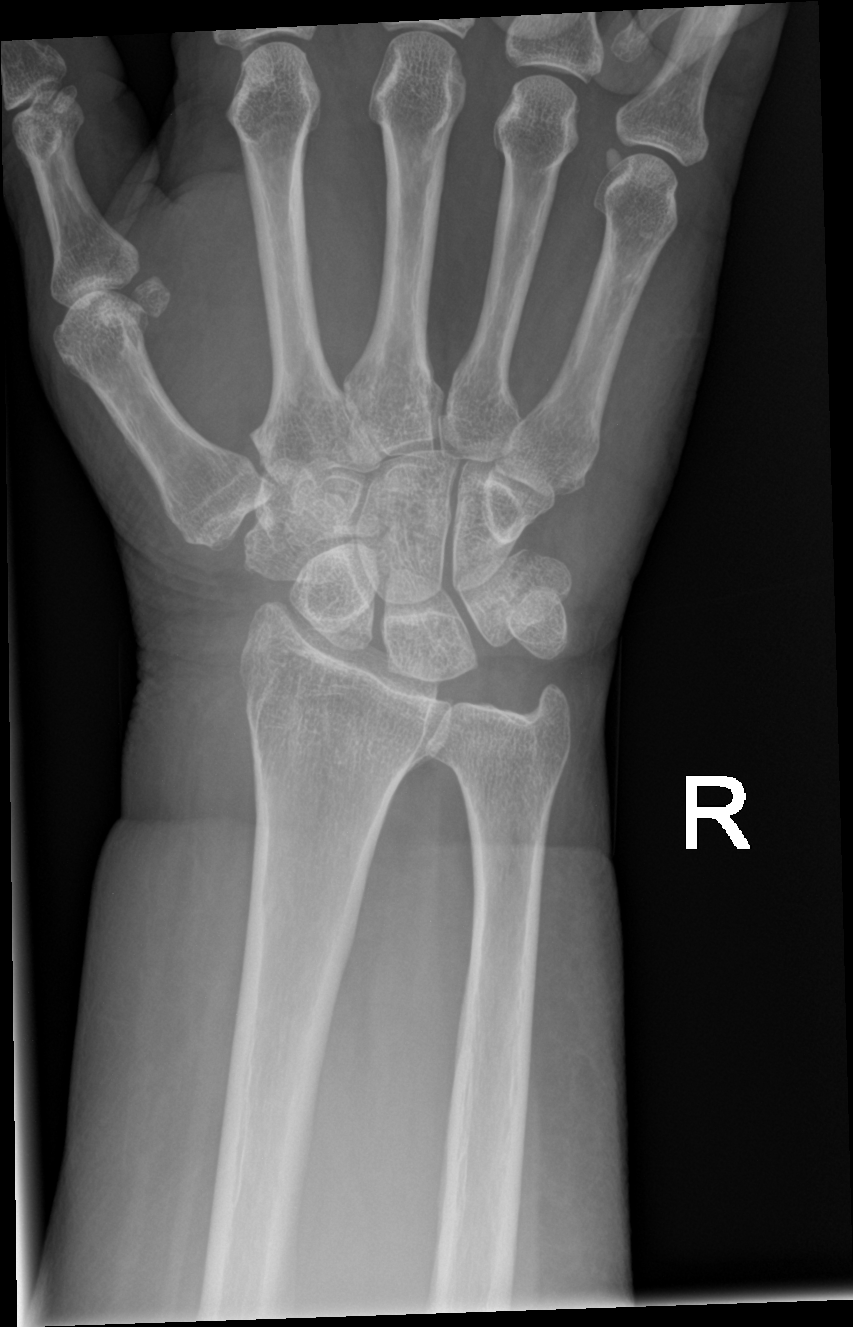

[wrist lat]
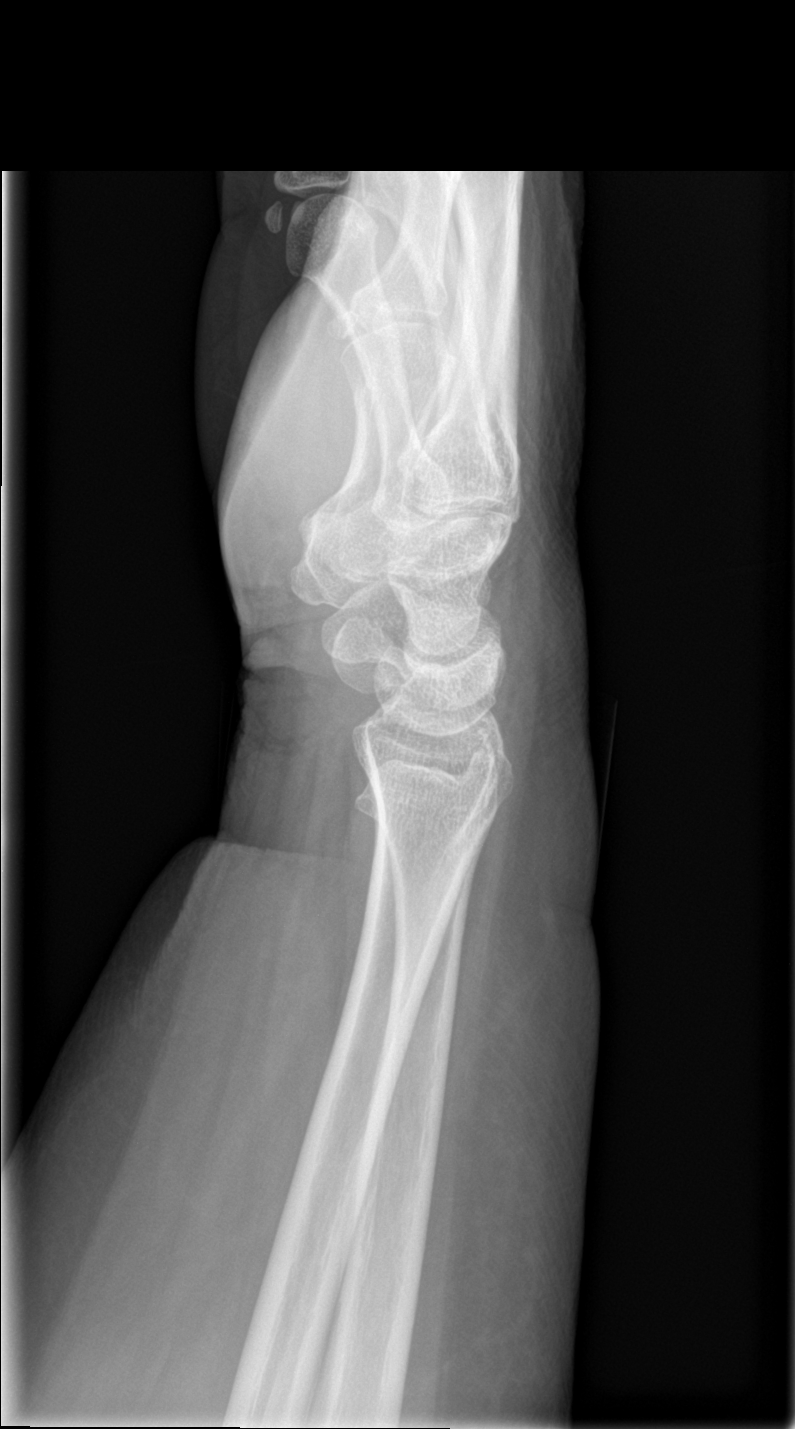

[2 of 2 positions shown; findings below may reference images not displayed]

FINDINGS: There is no evidence of acute fracture. Mild first carpometacarpal
joint degenerative change. Normal alignment.
IMPRESSION: No acute osseous abnormality.

## 2023-03-31 ENCOUNTER — Encounter: Payer: Self-pay | Admitting: Neurology

## 2023-03-31 ENCOUNTER — Ambulatory Visit: Payer: Self-pay | Admitting: Neurology

## 2023-05-13 ENCOUNTER — Ambulatory Visit (INDEPENDENT_AMBULATORY_CARE_PROVIDER_SITE_OTHER): Payer: Self-pay | Admitting: Physical Medicine and Rehabilitation

## 2023-05-13 ENCOUNTER — Encounter: Payer: Self-pay | Admitting: Physical Medicine and Rehabilitation

## 2023-05-13 VITALS — BP 126/72 | HR 72

## 2023-05-13 DIAGNOSIS — M5416 Radiculopathy, lumbar region: Secondary | ICD-10-CM

## 2023-05-13 DIAGNOSIS — G8929 Other chronic pain: Secondary | ICD-10-CM

## 2023-05-13 DIAGNOSIS — M47816 Spondylosis without myelopathy or radiculopathy, lumbar region: Secondary | ICD-10-CM

## 2023-05-13 DIAGNOSIS — M5441 Lumbago with sciatica, right side: Secondary | ICD-10-CM

## 2023-05-13 NOTE — Progress Notes (Signed)
 Madison Patterson - 58 y.o. female MRN [id number]  Date of birth: 1965/12/31  Office Visit Note: Visit Date: 05/13/2023 PCP: Harlen Labs, NP Referred by: Harlen Labs, NP  Subjective: Chief Complaint  Patient presents with   Lower Back - Pain   HPI: Madison Patterson is a 58 y.o. female who comes in today per the request of Harlen Labs, NP for evaluation of chronic intermittent bilateral lower back pain radiating to buttocks and down posterolateral leg. Also reports bilateral thoracic back pain. Spanish interpreter and patients son present during our visit. Her pain started in June of 2024 after bending over, pain radiating down the leg started about 3 weeks ago. States her pain is intermittent, worsens with laying down. She describes her pain as pulling sensation, currently rates as 5 out of 10. Some relief of pain with home exercise regimen, rest and use of medication. Some relief of pain with meloxicam as needed. No history of formal physical therapy. Lumbar radiographs from June of 2024 show straightening of the lumbar spine,  there is disc height loss and marginal osteophytes at L4-L5 and L5-S1 with associated facet joint arthropathy. No prior MRI imaging of lumbar spine. MRI of the brain from 2024 does show meningioma. She is followed by Dr. Windell Norfolk with [facility]. Patient denies focal weakness, numbness and tingling. No recent trauma or falls.       Review of Systems  Musculoskeletal:  Positive for back pain and myalgias.  Neurological:  Negative for tingling, sensory change, focal weakness and weakness.  All other systems reviewed and are negative.  Otherwise per HPI.  Assessment & Plan: Visit Diagnoses:    ICD-10-CM   1. Chronic bilateral low back pain with right-sided sciatica  M54.41    G89.29     2. Lumbar radiculopathy  M54.16     3. Facet arthropathy, lumbar  M47.816        Plan: Findings:  Chronic intermittent bilateral lower back  pain radiating down right posterolateral leg, bilateral thoracic back pain. Patient continues to have pain despite good conservative therapies such as home exercise regimen, rest and use of medications. Patients clinical presentation and exam are complex, differentials include lumbar radiculopathy and myofascial pain syndrome. She does have quite a bit of myofascial tenderness today to bilateral thoracic and lumbar paraspinal regions and bilateral hips. We discussed treatment plan in detail today. Next step is to place order for short course of formal physical therapy. I do think she would benefit from manual treatments, core strengthening and dry needling. Should her pain increase post physical therapy we discussed obtaining lumbar MRI imaging. I would like to see her back in approximately 8 weeks for re-evaluation. No red flag symptoms noted upon exam today.     Meds & Orders: No orders of the defined types were placed in this encounter.  No orders of the defined types were placed in this encounter.   Follow-up: Return for 8 week follow up for re-evaluation.   Procedures: No procedures performed      Clinical History: No specialty comments available.   She reports that she has never smoked. She has never used smokeless tobacco. No results for input(s): "HGBA1C", "LABURIC" in the last 8760 hours.  Objective:  VS:  HT:    WT:   BMI:     BP:126/72  HR:72bpm  TEMP: ( )  RESP:  Physical Exam Vitals and nursing note reviewed.  HENT:     Head: Normocephalic and atraumatic.  Right Ear: External ear normal.     Left Ear: External ear normal.     Nose: Nose normal.     Mouth/Throat:     Mouth: Mucous membranes are moist.  Eyes:     Extraocular Movements: Extraocular movements intact.  Cardiovascular:     Rate and Rhythm: Normal rate.     Pulses: Normal pulses.  Pulmonary:     Effort: Pulmonary effort is normal.  Abdominal:     General: Abdomen is flat. There is no distension.   Musculoskeletal:        General: Tenderness present.     Cervical back: Normal range of motion.     Comments: Patient rises from seated position to standing without difficulty. Good lumbar range of motion. No pain noted with facet loading. 5/5 strength noted with bilateral hip flexion, knee flexion/extension, ankle dorsiflexion/plantarflexion and EHL. No clonus noted bilaterally. No pain upon palpation of greater trochanters. No pain with internal/external rotation of bilateral hips. Sensation intact bilaterally. Myofascial tenderness noted to bilateral thoracic and lumbar paraspinal regions. Negative slump test bilaterally. Ambulates without aid, gait steady.     Skin:    General: Skin is warm and dry.     Capillary Refill: Capillary refill takes less than 2 seconds.  Neurological:     General: No focal deficit present.     Mental Status: She is alert and oriented to person, place, and time.  Psychiatric:        Mood and Affect: Mood normal.        Behavior: Behavior normal.     Ortho Exam  Imaging: No results found.  Past Medical/Family/Surgical/Social History: Medications & Allergies reviewed per EMR, new medications updated. There are no active problems to display for this patient.  History reviewed. No pertinent past medical history. History reviewed. No pertinent family history. Past Surgical History:  Procedure Laterality Date   PARTIAL HYSTERECTOMY     Social History   Occupational History   Not on file  Tobacco Use   Smoking status: Never   Smokeless tobacco: Never  Substance and Sexual Activity   Alcohol use: Not on file   Drug use: Never   Sexual activity: Not on file

## 2023-05-13 NOTE — Progress Notes (Signed)
 Pain Scale   Average Pain 10

## 2023-07-10 ENCOUNTER — Ambulatory Visit: Payer: Self-pay | Admitting: Physical Medicine and Rehabilitation

## 2023-07-20 ENCOUNTER — Encounter: Payer: Self-pay | Admitting: Radiology

## 2024-02-11 ENCOUNTER — Ambulatory Visit: Payer: Self-pay | Admitting: Physical Medicine and Rehabilitation

## 2024-04-12 ENCOUNTER — Encounter (HOSPITAL_BASED_OUTPATIENT_CLINIC_OR_DEPARTMENT_OTHER): Payer: Self-pay | Admitting: Pulmonary Disease

## 2024-04-12 DIAGNOSIS — R0683 Snoring: Secondary | ICD-10-CM
